# Patient Record
Sex: Male | Born: 1980 | Race: Black or African American | Hispanic: No | Marital: Married | Smoking: Former smoker
Health system: Southern US, Community
[De-identification: ages and names within clinical notes are randomized; demographics above are authoritative.]

---

## 2004-03-11 ENCOUNTER — Emergency Department (HOSPITAL_COMMUNITY): Admission: EM | Admit: 2004-03-11 | Discharge: 2004-03-11 | Payer: Self-pay | Admitting: Emergency Medicine

## 2004-04-01 ENCOUNTER — Emergency Department (HOSPITAL_COMMUNITY): Admission: EM | Admit: 2004-04-01 | Discharge: 2004-04-01 | Payer: Self-pay | Admitting: Emergency Medicine

## 2004-04-03 ENCOUNTER — Emergency Department (HOSPITAL_COMMUNITY): Admission: EM | Admit: 2004-04-03 | Discharge: 2004-04-03 | Payer: Self-pay | Admitting: Emergency Medicine

## 2004-04-04 ENCOUNTER — Emergency Department (HOSPITAL_COMMUNITY): Admission: EM | Admit: 2004-04-04 | Discharge: 2004-04-04 | Payer: Self-pay | Admitting: Emergency Medicine

## 2004-04-08 ENCOUNTER — Emergency Department (HOSPITAL_COMMUNITY): Admission: EM | Admit: 2004-04-08 | Discharge: 2004-04-08 | Payer: Self-pay | Admitting: Emergency Medicine

## 2004-08-13 ENCOUNTER — Emergency Department (HOSPITAL_COMMUNITY): Admission: EM | Admit: 2004-08-13 | Discharge: 2004-08-13 | Payer: Self-pay | Admitting: Emergency Medicine

## 2004-08-16 ENCOUNTER — Emergency Department (HOSPITAL_COMMUNITY): Admission: EM | Admit: 2004-08-16 | Discharge: 2004-08-16 | Payer: Self-pay | Admitting: Emergency Medicine

## 2004-10-28 ENCOUNTER — Ambulatory Visit: Payer: Self-pay | Admitting: Family Medicine

## 2004-11-03 ENCOUNTER — Ambulatory Visit: Payer: Self-pay | Admitting: Family Medicine

## 2005-06-22 ENCOUNTER — Emergency Department (HOSPITAL_COMMUNITY): Admission: EM | Admit: 2005-06-22 | Discharge: 2005-06-22 | Payer: Self-pay | Admitting: Emergency Medicine

## 2007-08-22 ENCOUNTER — Encounter: Payer: Self-pay | Admitting: Family Medicine

## 2009-06-01 ENCOUNTER — Emergency Department (HOSPITAL_COMMUNITY): Admission: EM | Admit: 2009-06-01 | Discharge: 2009-06-01 | Payer: Self-pay | Admitting: Emergency Medicine

## 2010-09-20 NOTE — Letter (Signed)
Summary: rpc chart  rpc chart   Imported By: Curtis Sites 06/06/2010 10:38:40  _____________________________________________________________________  External Attachment:    Type:   Image     Comment:   External Document

## 2011-10-21 ENCOUNTER — Encounter (HOSPITAL_COMMUNITY): Payer: Self-pay | Admitting: *Deleted

## 2011-10-21 ENCOUNTER — Emergency Department (HOSPITAL_COMMUNITY)
Admission: EM | Admit: 2011-10-21 | Discharge: 2011-10-21 | Disposition: A | Discharge status: Home / Self Care | Payer: Self-pay | Attending: Emergency Medicine | Admitting: Emergency Medicine

## 2011-10-21 DIAGNOSIS — IMO0002 Reserved for concepts with insufficient information to code with codable children: Secondary | ICD-10-CM | POA: Insufficient documentation

## 2011-10-21 DIAGNOSIS — L0291 Cutaneous abscess, unspecified: Secondary | ICD-10-CM

## 2011-10-21 MED ORDER — SULFAMETHOXAZOLE-TMP DS 800-160 MG PO TABS
1.0000 | ORAL_TABLET | Freq: Once | ORAL | Status: AC
  Administered 2011-10-21: 1 via ORAL
  Filled 2011-10-21: qty 1

## 2011-10-21 MED ORDER — HYDROCODONE-ACETAMINOPHEN 5-325 MG PO TABS
2.0000 | ORAL_TABLET | Freq: Once | ORAL | Status: AC
  Administered 2011-10-21: 2 via ORAL
  Filled 2011-10-21: qty 2

## 2011-10-21 MED ORDER — PENICILLIN V POTASSIUM 500 MG PO TABS: ORAL_TABLET | ORAL | Status: DC

## 2011-10-21 MED ORDER — SULFAMETHOXAZOLE-TRIMETHOPRIM 800-160 MG PO TABS: 1.0000 | ORAL_TABLET | Freq: Two times a day (BID) | ORAL | Status: AC

## 2011-10-21 MED ORDER — ONDANSETRON HCL 4 MG/2ML IJ SOLN
4.0000 mg | Freq: Once | INTRAMUSCULAR | Status: AC
  Administered 2011-10-21: 4 mg via INTRAVENOUS

## 2011-10-21 MED ORDER — HYDROCODONE-ACETAMINOPHEN 7.5-325 MG PO TABS: 1.0000 | ORAL_TABLET | ORAL | Status: AC | PRN

## 2011-10-21 MED ORDER — PENICILLIN V POTASSIUM 250 MG PO TABS
1000.0000 mg | ORAL_TABLET | Freq: Once | ORAL | Status: AC
  Administered 2011-10-21: 1000 mg via ORAL
  Filled 2011-10-21: qty 3
  Filled 2011-10-21: qty 1

## 2011-10-21 MED ORDER — IBUPROFEN 800 MG PO TABS
800.0000 mg | ORAL_TABLET | Freq: Once | ORAL | Status: AC
  Administered 2011-10-21: 800 mg via ORAL
  Filled 2011-10-21 (×2): qty 1

## 2011-10-21 NOTE — Discharge Instructions (Signed)
Please soak the arm pit areas in a tub of warm salt water 2 times daily. Penicillin 2 times daily with food until all taken. Septra 2 times daily with food until all taken. Ibuprofen 3 times daily. Norco for pain if needed. This medication may cause drowsiness, please use with caution. If the abscess areas are not improving in 4-6 days, please see the surgeon listed above for surgical evaluation and assistance with this problem.Abscess An abscess (boil or furuncle) is an infected area under your skin. This area is filled with yellowish white fluid (pus). HOME CARE   Only take medicine as told by your doctor.   Keep the skin clean around your abscess. Keep clothes that may touch the abscess clean.   Change any bandages (dressings) as told by your doctor.   Avoid direct skin contact with other people. The infection can spread by skin contact with others.   Practice good hygiene and do not share personal care items.   Do not share athletic equipment, towels, or whirlpools. Shower after every practice or work out session.   If a draining area cannot be covered:   Do not play sports.   Children should not go to daycare until the wound has healed or until fluid (drainage) stops coming out of the wound.   See your doctor for a follow-up visit as told.  GET HELP RIGHT AWAY IF:   There is more pain, puffiness (swelling), and redness in the wound site.   There is fluid or bleeding from the wound site.   You have muscle aches, chills, fever, or feel sick.   You or your child has a temperature by mouth above 102 F (38.9 C), not controlled by medicine.   Your baby is older than 3 months with a rectal temperature of 102 F (38.9 C) or higher.  MAKE SURE YOU:   Understand these instructions.   Will watch your condition.   Will get help right away if you are not doing well or get worse.  Document Released: 01/24/2008 Document Revised: 04/19/2011 Document Reviewed: 01/24/2008 [facility]  Patient Information 2012 [place], [place].

## 2011-10-21 NOTE — ED Notes (Signed)
Pt a/ox4. Resp even and unlabored. NAD at this time. D/C instructions reviewed with pt. Pt verbalized understanding. Pt ambulated to lobby with steady gate.  

## 2011-10-21 NOTE — ED Notes (Signed)
Abscess x 2 under right arm. States left arm was the same way but drained on its on and is better now.
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
## 2011-10-22 NOTE — ED Provider Notes (Signed)
Medical screening examination/treatment/procedure(s) were performed by non-physician practitioner and as supervising physician I was immediately available for consultation/collaboration.   Ramanda Paules L Lavanda Nevels, MD 10/22/11 2250 

## 2013-08-28 ENCOUNTER — Ambulatory Visit (INDEPENDENT_AMBULATORY_CARE_PROVIDER_SITE_OTHER): Payer: PRIVATE HEALTH INSURANCE | Admitting: Family Medicine

## 2013-08-28 ENCOUNTER — Encounter: Payer: Self-pay | Admitting: Family Medicine

## 2013-08-28 VITALS — BP 108/66 | HR 58 | Temp 98.7°F | Resp 20 | Ht 71.0 in | Wt 161.4 lb

## 2013-08-28 DIAGNOSIS — R7303 Prediabetes: Secondary | ICD-10-CM | POA: Insufficient documentation

## 2013-08-28 DIAGNOSIS — Z7689 Persons encountering health services in other specified circumstances: Secondary | ICD-10-CM

## 2013-08-28 DIAGNOSIS — R7309 Other abnormal glucose: Secondary | ICD-10-CM

## 2013-08-28 DIAGNOSIS — Z7189 Other specified counseling: Secondary | ICD-10-CM

## 2013-08-28 NOTE — Progress Notes (Signed)
Subjective:     Patient ID: Roberto Alexander, male   DOB: 24-May-1981, 33 y.o.   MRN: [id number]  HPI Comments: Roberto Alexander is a 33 y.o AAM here to establish care.  He works for a company and needed to review his labs they had drawn at work in October 2014 for insurance purposes. He has no medical conditions and isn't on any medications. He is married as has a 805 y.o daughter, who is also a patient here, Roberto Alexander.   He had borderline HTN and borderline DM on lab work. All the other labs were normal. He was in the normal range of risk factors and risk assessment. His Hgb A1c was 6.1. He doesn't really exercise but says he needs to get back on it. He says this is his New Year's resolution.   He has a fam hx positive for DM in his father and maternal GM and hypothyroidism in his mother. No heart disease or strokes runs in his family.   He denies tobacco use but has occasional alcohol use on special occasions. He denies any illicit drug use.     Review of Systems  All other systems reviewed and are negative.       Objective:   Physical Exam  Nursing note and vitals reviewed. Constitutional: He appears well-developed and well-nourished.  HENT:  Head: Normocephalic and atraumatic.  Psychiatric: He has a normal mood and affect. His behavior is normal.       Assessment:     [name] was seen today for new patient.  Diagnoses and associated orders for this visit:  Encounter to establish care  Prediabetes       Plan:     Follow up prn or 6 months after lifestyle modifications. Have discussed exercising at least 30 minutes a day, 5 days a week.

## 2013-08-28 NOTE — Patient Instructions (Signed)
Exercise to Stay Healthy Exercise helps you become and stay healthy. EXERCISE IDEAS AND TIPS Choose exercises that:  You enjoy.  Fit into your day. You do not need to exercise really hard to be healthy. You can do exercises at a slow or medium level and stay healthy. You can:  Stretch before and after working out.  Try yoga, Pilates, or tai chi.  Lift weights.  Walk fast, swim, jog, run, climb stairs, bicycle, dance, or rollerskate.  Take aerobic classes. Exercises that burn about 150 calories:  Running 1  miles in 15 minutes.  Playing volleyball for 45 to 60 minutes.  Washing and waxing a car for 45 to 60 minutes.  Playing touch football for 45 minutes.  Walking 1  miles in 35 minutes.  Pushing a stroller 1  miles in 30 minutes.  Playing basketball for 30 minutes.  Raking leaves for 30 minutes.  Bicycling 5 miles in 30 minutes.  Walking 2 miles in 30 minutes.  Dancing for 30 minutes.  Shoveling snow for 15 minutes.  Swimming laps for 20 minutes.  Walking up stairs for 15 minutes.  Bicycling 4 miles in 15 minutes.  Gardening for 30 to 45 minutes.  Jumping rope for 15 minutes.  Washing windows or floors for 45 to 60 minutes. Document Released: 09/09/2010 Document Revised: 10/30/2011 Document Reviewed: 09/09/2010 [employer] Patient Information 2014 [employer].  

## 2016-11-14 ENCOUNTER — Emergency Department (HOSPITAL_COMMUNITY)
Admission: EM | Admit: 2016-11-14 | Discharge: 2016-11-14 | Disposition: A | Discharge status: Home / Self Care | Payer: BLUE CROSS/BLUE SHIELD | Attending: Emergency Medicine | Admitting: Emergency Medicine

## 2016-11-14 DIAGNOSIS — L0889 Other specified local infections of the skin and subcutaneous tissue: Secondary | ICD-10-CM

## 2016-11-14 DIAGNOSIS — Z87891 Personal history of nicotine dependence: Secondary | ICD-10-CM | POA: Insufficient documentation

## 2016-11-14 DIAGNOSIS — R21 Rash and other nonspecific skin eruption: Secondary | ICD-10-CM | POA: Diagnosis present

## 2016-11-14 DIAGNOSIS — B353 Tinea pedis: Secondary | ICD-10-CM | POA: Diagnosis not present

## 2016-11-14 MED ORDER — FLUCONAZOLE 100 MG PO TABS: 100.0000 mg | ORAL_TABLET | Freq: Every day | ORAL | 0 refills | Status: AC

## 2016-11-14 MED ORDER — CEFDINIR 300 MG PO CAPS
300.0000 mg | ORAL_CAPSULE | Freq: Two times a day (BID) | ORAL | 0 refills | Status: AC
Start: 2016-11-14 — End: ?

## 2016-11-14 MED ORDER — ONDANSETRON HCL 4 MG PO TABS
4.0000 mg | ORAL_TABLET | Freq: Once | ORAL | Status: AC
  Administered 2016-11-14: 4 mg via ORAL
  Filled 2016-11-14: qty 1

## 2016-11-14 MED ORDER — IBUPROFEN 800 MG PO TABS
800.0000 mg | ORAL_TABLET | Freq: Once | ORAL | Status: AC
  Administered 2016-11-14: 800 mg via ORAL
  Filled 2016-11-14: qty 1

## 2016-11-14 MED ORDER — FLUCONAZOLE 100 MG PO TABS
200.0000 mg | ORAL_TABLET | Freq: Once | ORAL | Status: AC
  Administered 2016-11-14: 200 mg via ORAL
  Filled 2016-11-14: qty 2

## 2016-11-14 MED ORDER — HYDROCODONE-ACETAMINOPHEN 5-325 MG PO TABS
1.0000 | ORAL_TABLET | ORAL | 0 refills | Status: DC | PRN
Start: 2016-11-14 — End: 2017-02-21

## 2016-11-14 MED ORDER — CEPHALEXIN 500 MG PO CAPS
500.0000 mg | ORAL_CAPSULE | Freq: Once | ORAL | Status: AC
  Administered 2016-11-14: 500 mg via ORAL
  Filled 2016-11-14: qty 1

## 2016-11-14 NOTE — Discharge Instructions (Signed)
Please cleanse your feet with soap and water daily. Dryer feet thoroughly. Please elevate your feet and let them be open to air when possible. Use clean white socks daily. Change your dressing to your feet daily. Alternate your shoes. Please continue the athlete's foot gel you were given by the podiatry specialist. Please add Diflucan and Omnicef daily with food. See your primary physician or your podiatry specialist for additional evaluation if not improving.

## 2016-11-14 NOTE — ED Provider Notes (Signed)
[facility]-EMERGENCY DEPT Provider Note   CSN: [id number] Arrival date & time: 11/14/16  2046  By signing my name below, I, Modena JanskyAlbert Thayil, attest that this documentation has been prepared under the direction and in the presence of non-physician practitioner, Ivery QualeHobson Sumi Lye, PA-C. Electronically Signed: Modena JanskyAlbert Thayil, Scribe. 11/14/2016. 9:23 PM.  History   Chief Complaint Chief Complaint  Patient presents with  . Foot Pain   The history is provided by the patient. No language interpreter was used.  Rash   This is a new problem. The current episode started more than 2 days ago. The problem has been gradually worsening. The problem is associated with an unknown factor. There has been no fever. The rash is present on the right toes, right foot, right hand, left hand and genitalia. The pain is mild. The pain has been constant since onset. Associated symptoms include blisters.   HPI Comments: Roberto Alexander is a 36 y.o. male who presents to the Emergency Department complaining of constant moderate bilateral foot rash that started about a week ago. He reports having a blister with suspected fungus. He has been having discharge for the past week. He was seen by a podiatrist for the same complaint and given a topical gel and oral medication. His complaint has worsened and spread to his hands and genital area. He denies any other complaints.   No past medical history on file.  Patient Active Problem List   Diagnosis Date Noted  . Encounter to establish care 08/28/2013  . Prediabetes 08/28/2013    No past surgical history on file.     Home Medications    Prior to Admission medications   Not on File    Family History No family history on file.  Social History Social History  Substance Use Topics  . Smoking status: Former Games developermoker  . Smokeless tobacco: Not on file  . Alcohol use No     Allergies   Patient has no known allergies.   Review of Systems Review of Systems    Musculoskeletal: Positive for myalgias.  Skin: Positive for rash.  All other systems reviewed and are negative.    Physical Exam Updated Vital Signs BP 124/71 (BP Location: Right Arm)   Pulse 72   Temp 98 F (36.7 C) (Oral)   Resp 18   Ht 6\' 1"  (1.854 m)   Wt 165 lb (74.8 kg)   SpO2 100%   BMI 21.77 kg/m   Physical Exam  Constitutional: He appears well-developed and well-nourished. No distress.  HENT:  Head: Normocephalic and atraumatic.  Eyes: Conjunctivae are normal.  Neck: Neck supple.  Cardiovascular: Normal rate and regular rhythm.   No murmur heard. Pulmonary/Chest: Effort normal. No respiratory distress. He has no wheezes. He has no rales.  Abdominal: Soft.  Musculoskeletal: Normal range of motion.  Right foot: Emaciated skin with ruptured blisters to the dorsum of the right foot and web spaces between toes. Leaking of fluid present. Raw area at the base of the 5th metatarsal, also with drainage. Peeling of the skin on the plantar surface, mostly in the metatarsal heads. No evidence of puncture wound to the plantar surface. Area of dryness to dorsum. DP pulse is +2 PT pulse is +2. No red streaks going up the leg.   Left foot: Broken skin area on the web space between the 3rd and 4th and 4th and 5th toes of the left foot. Multiple macular bumps on the dorsum of the foot. No ulcer on the plantar  surface under the left 5th toes. No puncture wound of the plantar left foot. DP pulse is +2. PT pulse is +2. No red streaks going up the leg.   Full ROM of the ankles and toes.  Neurological: He is alert.  Skin: Skin is warm and dry.  Psychiatric: He has a normal mood and affect.  Nursing note and vitals reviewed.    ED Treatments / Results  DIAGNOSTIC STUDIES: Oxygen Saturation is 100% on RA, normal by my interpretation.    COORDINATION OF CARE: 9:27 PM- Pt advised of plan for treatment and pt agrees.  Labs (all labs ordered are listed, but only abnormal results are  displayed) Labs Reviewed - No data to display  EKG  EKG Interpretation None       Radiology No results found.  Procedures Procedures (including critical care time)  Medications Ordered in ED Medications  fluconazole (DIFLUCAN) tablet 200 mg (not administered)  ondansetron (ZOFRAN) tablet 4 mg (not administered)  ibuprofen (ADVIL,MOTRIN) tablet 800 mg (not administered)  cephALEXin (KEFLEX) capsule 500 mg (not administered)     Initial Impression / Assessment and Plan / ED Course  I have reviewed the triage vital signs and the nursing notes.  Pertinent labs & imaging results that were available during my care of the patient were reviewed by me and considered in my medical decision making (see chart for details).     **I have reviewed nursing notes, vital signs, and all appropriate lab and imaging results for this patient.*  Final Clinical Impressions(s) / ED Diagnoses   MDM: Examination suggests fungus infection with secondary skin infection. Pt advised to cleanse area with soap and water and dry thoroughly. Prescription for Diflucan and Ceftin given to the pt. Pt is to follow with PCP or podiatrist. Pt also advised to rotate his shoe and use clean white socks.   Final diagnoses:  Tinea pedis of both feet  Secondary infection of skin    New Prescriptions Discharge Medication List as of 11/14/2016  9:58 PM    START taking these medications   Details  cefdinir (OMNICEF) 300 MG capsule Take 1 capsule (300 mg total) by mouth 2 (two) times daily., Starting Tue 11/14/2016, Print    fluconazole (DIFLUCAN) 100 MG tablet Take 1 tablet (100 mg total) by mouth daily., Starting Tue 11/14/2016, Print    HYDROcodone-acetaminophen (NORCO/VICODIN) 5-325 MG tablet Take 1 tablet by mouth every 4 (four) hours as needed., Starting Tue 11/14/2016, Print       **I personally performed the services described in this documentation, which was scribed in my presence. The recorded information  has been reviewed and is accurate.Ivery Quale, PA-C 11/15/16 0032    Benjiman Core, MD 11/15/16 [phone]

## 2016-11-14 NOTE — ED Triage Notes (Addendum)
Pt reports seeing a podiatrist last week for a fungus on his right foot. Pt reports he was given a "pill" and a gel. Pt reports he feels like it is getting worse and like there is a blister on the bottom of his foot. Pt states he has a small amount of fungus on his left foot. Pt having difficulty walking. Pt reports he feels like it is spreading on his hands, leg and private areas.

## 2017-02-21 ENCOUNTER — Emergency Department (HOSPITAL_COMMUNITY): Payer: BLUE CROSS/BLUE SHIELD

## 2017-02-21 ENCOUNTER — Encounter (HOSPITAL_COMMUNITY): Payer: Self-pay | Admitting: Vascular Surgery

## 2017-02-21 ENCOUNTER — Emergency Department (HOSPITAL_COMMUNITY)
Admission: EM | Admit: 2017-02-21 | Discharge: 2017-02-21 | Disposition: A | Discharge status: Home / Self Care | Payer: BLUE CROSS/BLUE SHIELD | Attending: Emergency Medicine | Admitting: Emergency Medicine

## 2017-02-21 DIAGNOSIS — S0993XA Unspecified injury of face, initial encounter: Secondary | ICD-10-CM

## 2017-02-21 DIAGNOSIS — Z87891 Personal history of nicotine dependence: Secondary | ICD-10-CM | POA: Diagnosis not present

## 2017-02-21 DIAGNOSIS — Z23 Encounter for immunization: Secondary | ICD-10-CM | POA: Diagnosis not present

## 2017-02-21 DIAGNOSIS — S01111A Laceration without foreign body of right eyelid and periocular area, initial encounter: Secondary | ICD-10-CM | POA: Insufficient documentation

## 2017-02-21 DIAGNOSIS — Y929 Unspecified place or not applicable: Secondary | ICD-10-CM | POA: Diagnosis not present

## 2017-02-21 DIAGNOSIS — Y998 Other external cause status: Secondary | ICD-10-CM | POA: Insufficient documentation

## 2017-02-21 DIAGNOSIS — S0281XA Fracture of other specified skull and facial bones, right side, initial encounter for closed fracture: Secondary | ICD-10-CM | POA: Insufficient documentation

## 2017-02-21 DIAGNOSIS — Y939 Activity, unspecified: Secondary | ICD-10-CM | POA: Diagnosis not present

## 2017-02-21 DIAGNOSIS — S0285XA Fracture of orbit, unspecified, initial encounter for closed fracture: Secondary | ICD-10-CM

## 2017-02-21 DIAGNOSIS — S0181XA Laceration without foreign body of other part of head, initial encounter: Secondary | ICD-10-CM

## 2017-02-21 MED ORDER — HYDROCODONE-ACETAMINOPHEN 5-325 MG PO TABS
1.0000 | ORAL_TABLET | Freq: Four times a day (QID) | ORAL | 0 refills | Status: AC | PRN
Start: 2017-02-21 — End: ?

## 2017-02-21 MED ORDER — HYDROCODONE-ACETAMINOPHEN 5-325 MG PO TABS: 1.0000 | ORAL_TABLET | ORAL | 0 refills | Status: AC | PRN

## 2017-02-21 MED ORDER — TETANUS-DIPHTH-ACELL PERTUSSIS 5-2.5-18.5 LF-MCG/0.5 IM SUSP
0.5000 mL | Freq: Once | INTRAMUSCULAR | Status: AC
  Administered 2017-02-21: 0.5 mL via INTRAMUSCULAR
  Filled 2017-02-21: qty 0.5

## 2017-02-21 MED ORDER — SULFAMETHOXAZOLE-TRIMETHOPRIM 800-160 MG PO TABS: 1.0000 | ORAL_TABLET | Freq: Two times a day (BID) | ORAL | 0 refills | Status: AC

## 2017-02-21 MED ORDER — SULFAMETHOXAZOLE-TRIMETHOPRIM 800-160 MG PO TABS
1.0000 | ORAL_TABLET | Freq: Once | ORAL | Status: AC
  Administered 2017-02-21: 1 via ORAL
  Filled 2017-02-21: qty 1

## 2017-02-21 MED ORDER — ACETAMINOPHEN 325 MG PO TABS
650.0000 mg | ORAL_TABLET | Freq: Once | ORAL | Status: AC
Start: 2017-02-21 — End: 2017-02-21
  Administered 2017-02-21: 650 mg via ORAL
  Filled 2017-02-21: qty 2

## 2017-02-21 MED ORDER — LIDOCAINE-EPINEPHRINE (PF) 2 %-1:200000 IJ SOLN
10.0000 mL | Freq: Once | INTRAMUSCULAR | Status: AC
  Administered 2017-02-21: 10 mL
  Filled 2017-02-21: qty 20

## 2017-02-21 NOTE — ED Notes (Signed)
Pt to CT at this time.
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
## 2017-02-21 NOTE — ED Notes (Signed)
Pt has ice on his face.
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
## 2018-03-25 IMAGING — CT CT MAXILLOFACIAL W/O CM
3 of 8 series · 14 of 47 positions shown, 17 images · non-contrast
Comparison: Maxillofacial CT performed 06/22/2005

CLINICAL DATA: Status post facial trauma.  Initial encounter.

EXAM:
CT MAXILLOFACIAL WITHOUT CONTRAST
TECHNIQUE: Multidetector CT imaging of the maxillofacial structures was
performed. Multiplanar CT image reconstructions were also generated.
A small metallic BB was placed on the right temple in order to
reliably differentiate right from left.

[Series 4: st thins · axial · 0.35mm/px · z∈[-162,-23]mm · 9 of 249 slices shown, 12 images]
[im 25/249  brain]
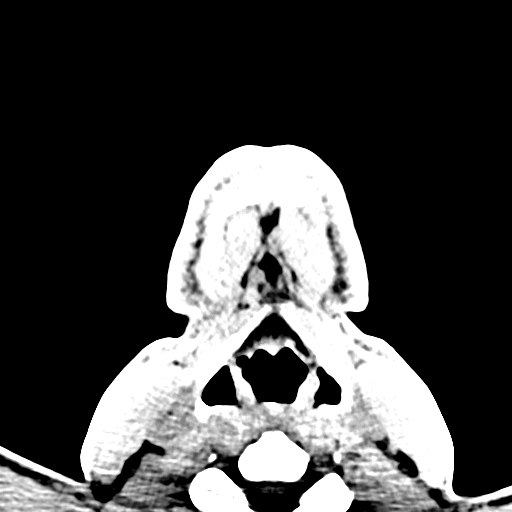
[im 25/249  bone]
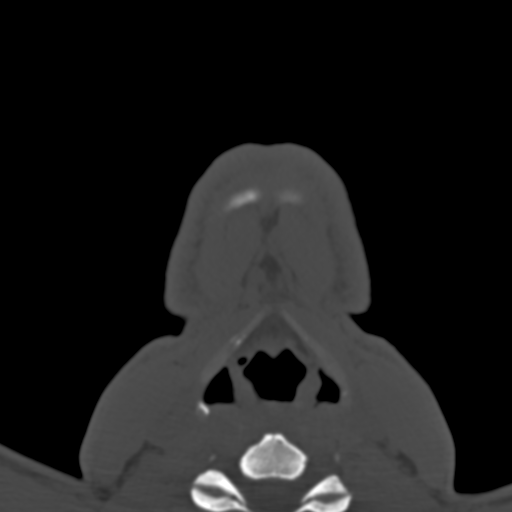
[im 50/249  bone]
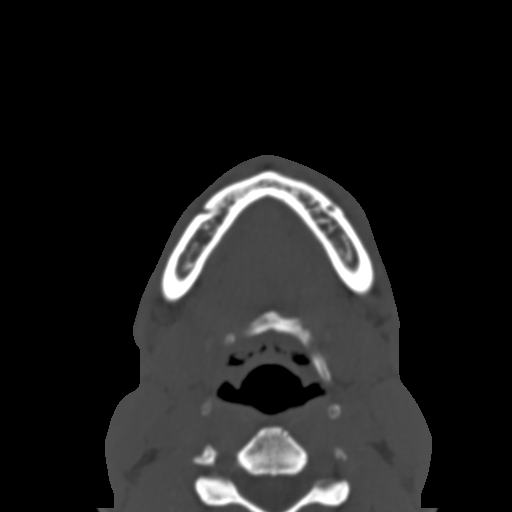
[im 75/249  bone]
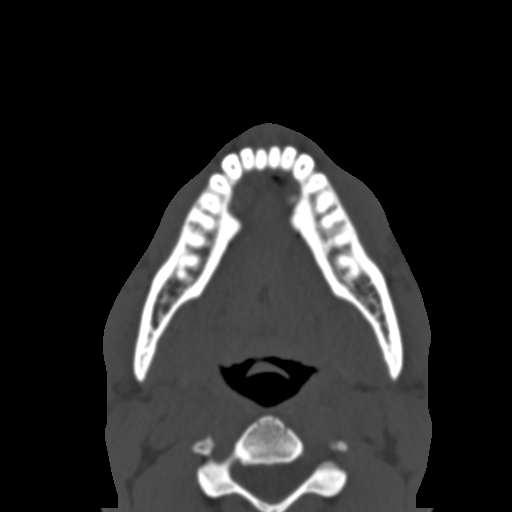
[im 100/249  bone]
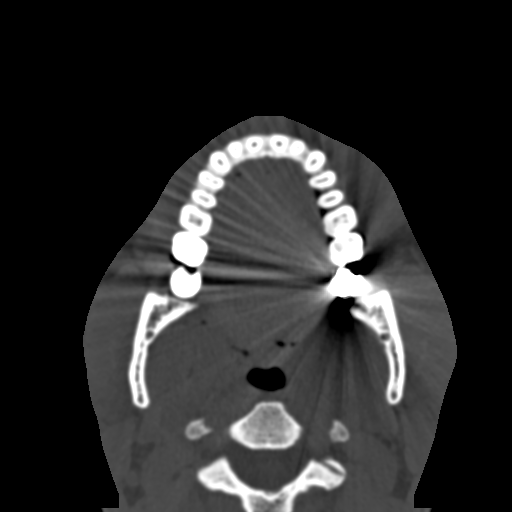
[im 125/249  brain]
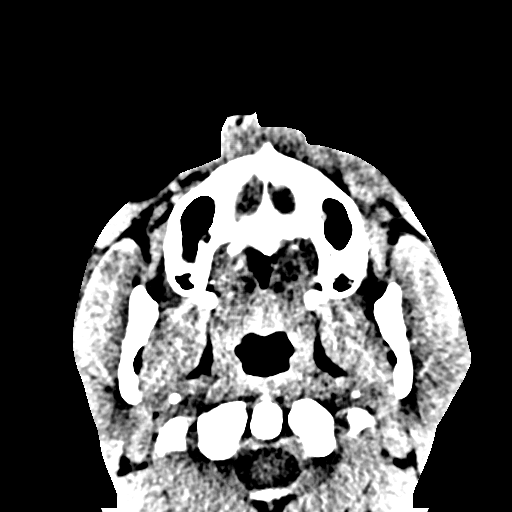
[im 125/249  bone]
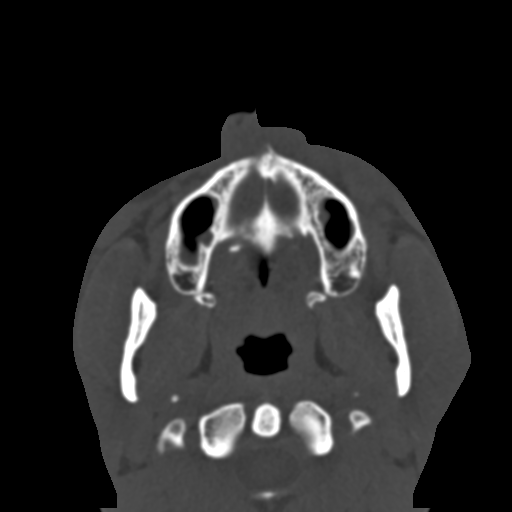
[im 149/249  bone]
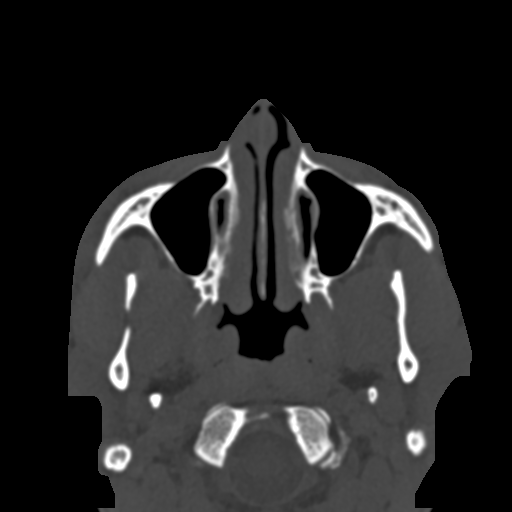
[im 174/249  bone]
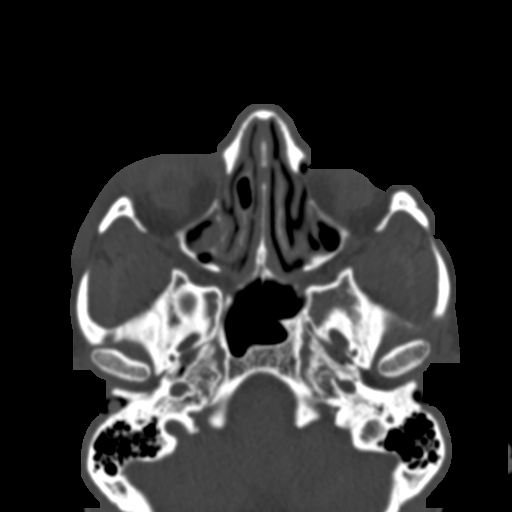
[im 199/249  bone]
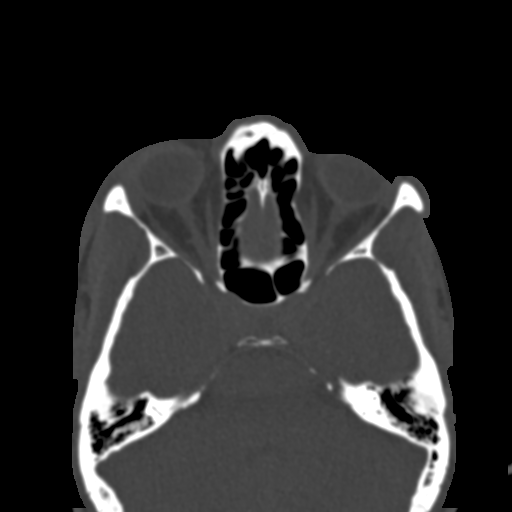
[im 224/249  brain]
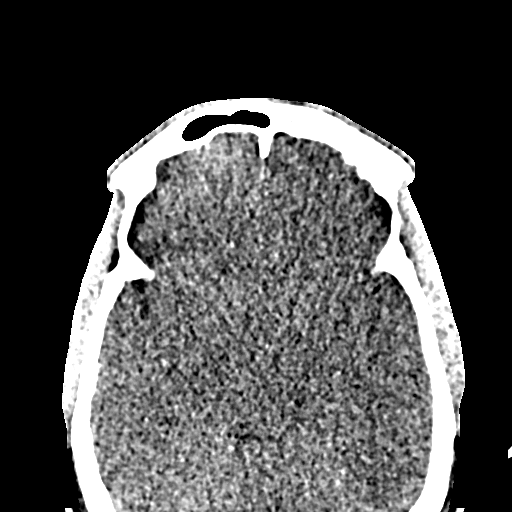
[im 224/249  bone]
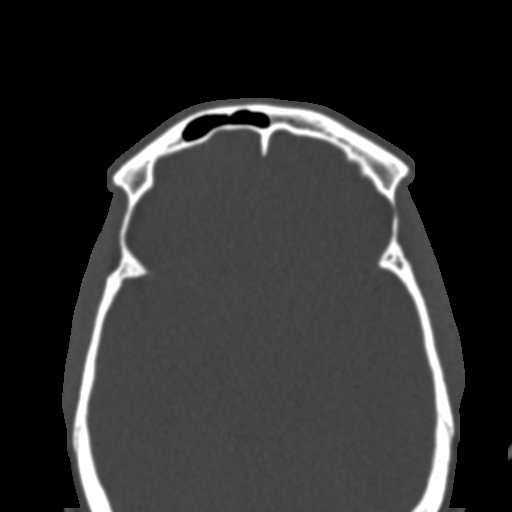

[Series 7: st cor · coronal · 0.34mm/px · 3 of 76 slices shown]
[im 19/76  bone]
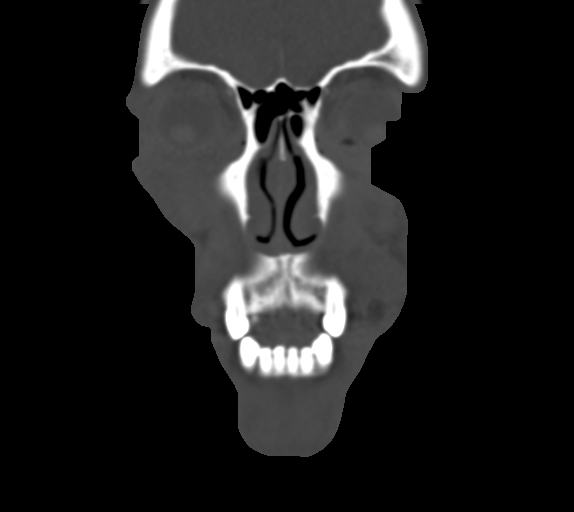
[im 38/76  bone]
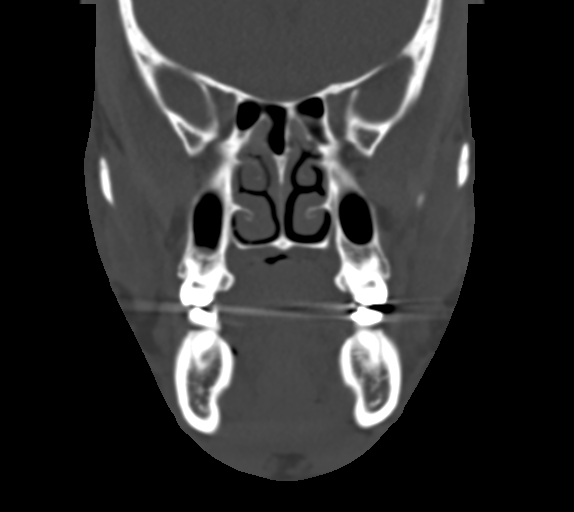
[im 57/76  bone]
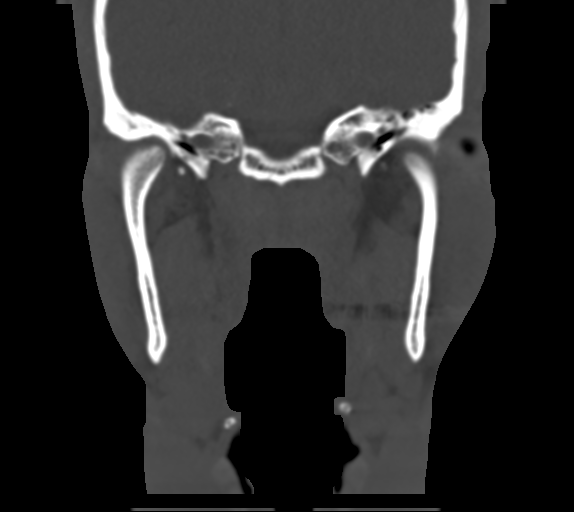

[Series 10: bone sag · sagittal · 0.34mm/px · 2 of 76 slices shown]
[im 26/76  bone]
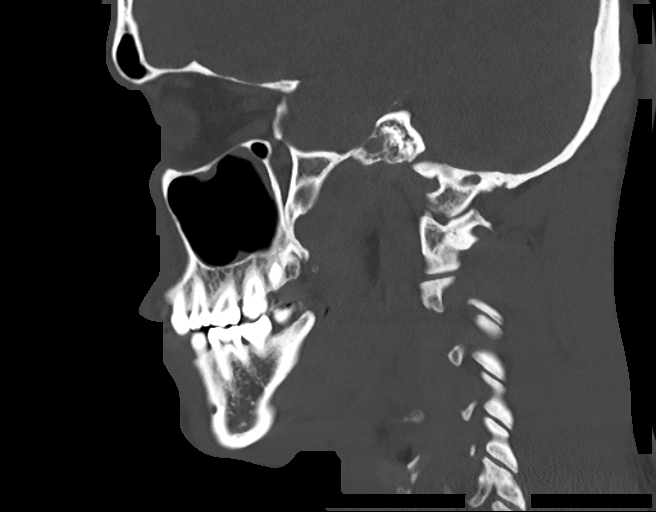
[im 51/76  bone]
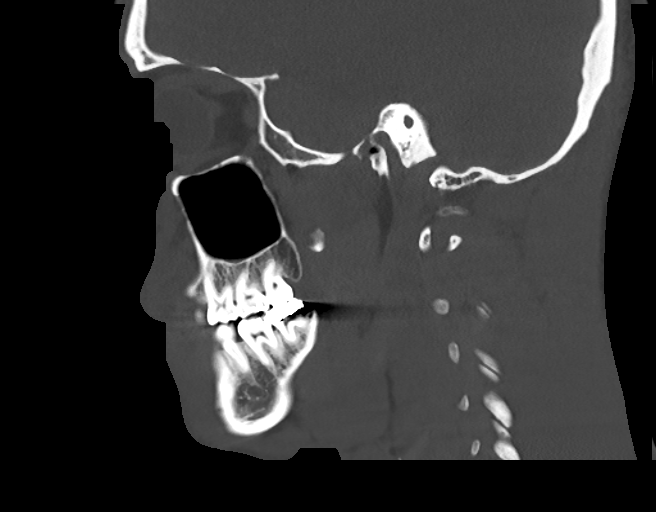

[14 of 47 positions shown; findings below may reference images not displayed]

FINDINGS: Osseous: There is no evidence of fracture or dislocation. The
maxilla and mandible appear intact. The nasal bone is unremarkable
in appearance. The visualized dentition demonstrates no acute
abnormality.

Orbits: The orbits are intact bilaterally.

Sinuses: The visualized paranasal sinuses and mastoid air cells are
well-aerated.

Soft tissues: Soft tissue swelling is noted surrounding the right
orbit and overlying the right maxilla.

The parapharyngeal fat planes are preserved. The nasopharynx,
oropharynx and hypopharynx are unremarkable in appearance. The
visualized portions of the valleculae and piriform sinuses are
grossly unremarkable. The parotid and submandibular glands are
within normal limits. No cervical lymphadenopathy is seen.

Limited intracranial: The visualized portions of the brain are
unremarkable.
IMPRESSION: 1. No evidence of fracture or dislocation with regard to the
maxillofacial structures.
2. Soft tissue swelling surrounding the right orbit and overlying
the right maxilla.

## 2019-04-07 ENCOUNTER — Other Ambulatory Visit: Payer: Self-pay

## 2019-04-07 DIAGNOSIS — Z20822 Contact with and (suspected) exposure to covid-19: Secondary | ICD-10-CM

## 2019-04-09 LAB — NOVEL CORONAVIRUS, NAA: SARS-CoV-2, NAA: NOT DETECTED

## 2020-04-22 ENCOUNTER — Other Ambulatory Visit: Payer: Self-pay

## 2020-04-22 ENCOUNTER — Encounter: Payer: Self-pay | Admitting: Emergency Medicine

## 2020-04-22 ENCOUNTER — Ambulatory Visit
Admission: EM | Admit: 2020-04-22 | Discharge: 2020-04-22 | Disposition: A | Discharge status: Home / Self Care | Payer: BC Managed Care – PPO | Attending: Emergency Medicine | Admitting: Emergency Medicine

## 2020-04-22 DIAGNOSIS — R05 Cough: Secondary | ICD-10-CM

## 2020-04-22 DIAGNOSIS — R059 Cough, unspecified: Secondary | ICD-10-CM

## 2020-04-22 NOTE — ED Triage Notes (Signed)
Wants covid test

## 2020-04-24 LAB — NOVEL CORONAVIRUS, NAA: SARS-CoV-2, NAA: DETECTED — AB

## 2020-04-26 ENCOUNTER — Telehealth: Payer: Self-pay | Admitting: Nurse Practitioner

## 2020-04-26 NOTE — Telephone Encounter (Signed)
Called to discuss with Merilynn Finland about Covid symptoms and the use of casirivimab/imdevimab, a combination monoclonal antibody infusion for those with mild to moderate Covid symptoms and at a high risk of hospitalization.     Pt is qualified for this infusion at the [facility] infusion center due to co-morbid conditions (as indicated below)  and/or a member of an at-risk group. Unable to reach. Voicemail left.    Patient Active Problem List   Diagnosis Date Noted  . Encounter to establish care 08/28/2013  . Prediabetes 08/28/2013    Willette Alma, AGPCNP-BC

## 2022-04-13 ENCOUNTER — Ambulatory Visit
Admission: EM | Admit: 2022-04-13 | Discharge: 2022-04-13 | Disposition: A | Discharge status: Home / Self Care | Payer: BC Managed Care – PPO

## 2022-04-13 ENCOUNTER — Ambulatory Visit (INDEPENDENT_AMBULATORY_CARE_PROVIDER_SITE_OTHER): Payer: Self-pay

## 2022-04-13 DIAGNOSIS — K5900 Constipation, unspecified: Secondary | ICD-10-CM

## 2022-04-13 DIAGNOSIS — R1033 Periumbilical pain: Secondary | ICD-10-CM

## 2022-04-13 DIAGNOSIS — R109 Unspecified abdominal pain: Secondary | ICD-10-CM

## 2022-04-13 LAB — POCT URINALYSIS DIP (MANUAL ENTRY)
Bilirubin, UA: NEGATIVE
Blood, UA: NEGATIVE
Glucose, UA: NEGATIVE mg/dL
Ketones, POC UA: NEGATIVE mg/dL
Leukocytes, UA: NEGATIVE
Nitrite, UA: NEGATIVE
Protein Ur, POC: 30 mg/dL — AB
Spec Grav, UA: 1.025 (ref 1.010–1.025)
Urobilinogen, UA: 0.2 E.U./dL
pH, UA: 6.5 (ref 5.0–8.0)

## 2022-04-13 MED ORDER — ALUM & MAG HYDROXIDE-SIMETH 200-200-20 MG/5ML PO SUSP
30.0000 mL | Freq: Once | ORAL | Status: AC
  Administered 2022-04-13: 30 mL via ORAL

## 2022-04-13 MED ORDER — SENNA 8.6 MG PO TABS: 1.0000 | ORAL_TABLET | Freq: Every day | ORAL | 0 refills | Status: AC

## 2022-04-13 MED ORDER — MYLANTA MAXIMUM STRENGTH 400-400-40 MG/5ML PO SUSP: 15.0000 mL | Freq: Four times a day (QID) | ORAL | 0 refills | Status: AC | PRN

## 2022-04-13 MED ORDER — POLYETHYLENE GLYCOL 3350 17 GM/SCOOP PO POWD: 1.0000 | Freq: Once | ORAL | 0 refills | Status: AC

## 2022-04-13 NOTE — ED Triage Notes (Signed)
Pt reports abdominal pain after eating x 1 week. Pt think he has an ulcer. Tums gives some relief.

## 2022-04-13 NOTE — Discharge Instructions (Addendum)
Your urinalysis is negative for urinary tract infection.  Your x-rays do show "moderate volume of formed stool throughout the colon suggestive of constipation". Take medication as prescribed. Increase fluids.  Try to drink at least 10-12 8 ounce glasses of water daily this will help with constipation. Increase the fruits and vegetables in your diet.  This will also help with constipation. Remain as active as possible. May take Tylenol as needed for pain or discomfort. May continue use of Tums as needed.  If you continue to experience abdominal pain after eating or if symptoms worsen, please follow-up with your primary care physician for further evaluation. Go to the emergency department immediately if you develop fever, chills, worsening abdominal pain, nausea or vomiting that you cannot control, or other concerns.

## 2022-04-13 NOTE — ED Provider Notes (Signed)
[facility]    CSN: [id number] Arrival date & time: 04/13/22  1033      History   Chief Complaint Chief Complaint  Patient presents with   Abdominal Pain    HPI Roberto Alexander is a 41 y.o. male.   The history is provided by the patient.   Patient presents with 1 week history of abdominal pain.  Patient states pain occurs after he eats.  Pain is located around the umbilicus.  He also endorses bloating and gas.  Patient describes the pain as "burning" and states that the pain comes and goes.  He states that it "hit him" when he was working 1 day ago.  His last bowel movement was 1 day ago.  He denies fever, chills, chest pain, heartburn, nausea, vomiting, constipation, diarrhea, or urinary symptoms.  Patient states that he has tried Tylenol which has not given him any relief.  Reports some relief with the use of Tums.  History reviewed. No pertinent past medical history.  Patient Active Problem List   Diagnosis Date Noted   Encounter to establish care 08/28/2013   Prediabetes 08/28/2013    History reviewed. No pertinent surgical history.     Home Medications    Prior to Admission medications   Medication Sig Start Date End Date Taking? Authorizing Provider  alum & mag hydroxide-simeth (MYLANTA MAXIMUM STRENGTH) 400-400-40 MG/5ML suspension Take 15 mLs by mouth every 6 (six) hours as needed for indigestion. 04/13/22  Yes Jayen Bromwell-Warren, Sadie Haber, NP  calcium carbonate (TUMS - DOSED IN MG ELEMENTAL CALCIUM) 500 MG chewable tablet Chew 1 tablet by mouth daily.   Yes [provider]  polyethylene glycol powder (GLYCOLAX/MIRALAX) 17 GM/SCOOP powder Take 255 g by mouth once for 1 dose. 04/13/22 04/13/22 Yes Liannah Yarbough-Warren, Sadie Haber, NP  senna (SENOKOT) 8.6 MG TABS tablet Take 1 tablet (8.6 mg total) by mouth at bedtime. 04/13/22 05/13/22 Yes Shaena Parkerson-Warren, Sadie Haber, NP  cefdinir (OMNICEF) 300 MG capsule Take 1 capsule (300 mg total) by mouth 2 (two) times  daily. Patient not taking: Reported on 02/21/2017 11/14/16   Ivery Quale, PA-C  fluconazole (DIFLUCAN) 100 MG tablet Take 1 tablet (100 mg total) by mouth daily. Patient not taking: Reported on 02/21/2017 11/14/16   Ivery Quale, PA-C  HYDROcodone-acetaminophen (NORCO/VICODIN) 5-325 MG tablet Take 1 tablet by mouth every 4 (four) hours as needed. 02/21/17   Earley Favor, NP  HYDROcodone-acetaminophen (NORCO/VICODIN) 5-325 MG tablet Take 1 tablet by mouth every 6 (six) hours as needed. 02/21/17   Earley Favor, NP    Family History History reviewed. No pertinent family history.  Social History Social History   Tobacco Use   Smoking status: Former    Types: Cigarettes   Smokeless tobacco: Never  Substance Use Topics   Alcohol use: Yes    Comment: occasionally   Drug use: No     Allergies   Keflex [cephalexin]   Review of Systems Review of Systems Per HPI  Physical Exam Triage Vital Signs ED Triage Vitals  Enc Vitals Group     BP 04/13/22 1043 125/82     Pulse Rate 04/13/22 1043 72     Resp 04/13/22 1043 16     Temp 04/13/22 1043 98.8 F (37.1 C)     Temp Source 04/13/22 1043 Oral     SpO2 04/13/22 1043 97 %     Weight --      Height --      Head Circumference --  Peak Flow --      Pain Score 04/13/22 1041 10     Pain Loc --      Pain Edu? --      Excl. in GC? --    No data found.  Updated Vital Signs BP 125/82 (BP Location: Right Arm)   Pulse 72   Temp 98.8 F (37.1 C) (Oral)   Resp 16   SpO2 97%   Visual Acuity Right Eye Distance:   Left Eye Distance:   Bilateral Distance:    Right Eye Near:   Left Eye Near:    Bilateral Near:     Physical Exam Vitals and nursing note reviewed.  Constitutional:      General: He is not in acute distress.    Appearance: He is well-developed.  HENT:     Head: Normocephalic.  Eyes:     Extraocular Movements: Extraocular movements intact.     Conjunctiva/sclera: Conjunctivae normal.     Pupils: Pupils are  equal, round, and reactive to light.  Cardiovascular:     Rate and Rhythm: Normal rate and regular rhythm.     Heart sounds: Normal heart sounds.  Pulmonary:     Effort: Pulmonary effort is normal.     Breath sounds: Normal breath sounds.  Abdominal:     General: Abdomen is flat. Bowel sounds are normal.     Palpations: Abdomen is soft.     Tenderness: There is no abdominal tenderness. There is no right CVA tenderness, left CVA tenderness or guarding.     Comments: Reports improvement of pain after Mylanta/Maalox administration.  Musculoskeletal:     Cervical back: Normal range of motion.  Lymphadenopathy:     Cervical: No cervical adenopathy.  Skin:    General: Skin is warm and dry.  Neurological:     General: No focal deficit present.     Mental Status: He is alert and oriented to person, place, and time.  Psychiatric:        Mood and Affect: Mood normal.        Behavior: Behavior normal.      UC Treatments / Results  Labs (all labs ordered are listed, but only abnormal results are displayed) Labs Reviewed  POCT URINALYSIS DIP (MANUAL ENTRY) - Abnormal; Notable for the following components:      Result Value   Protein Ur, POC =30 (*)    All other components within normal limits    EKG   Radiology DG Abd 1 View  Result Date: 04/13/2022 CLINICAL DATA:  One week of abdominal pain postprandial. EXAM: ABDOMEN - 1 VIEW COMPARISON:  None Available. FINDINGS: The bowel gas pattern is normal. Calcification projecting adjacent to the right T4 transverse process is favored to be an ingested tablet located within the hepatic flexure. Moderate volume of formed stool throughout the colon. IMPRESSION: Moderate volume of formed stool throughout the colon suggestive of constipation. No evidence of bowel obstruction. Electronically Signed   By: Maudry Mayhew M.D.   On: 04/13/2022 11:31    Procedures Procedures (including critical care time)  Medications Ordered in UC Medications   alum & mag hydroxide-simeth (MAALOX/MYLANTA) 200-200-20 MG/5ML suspension 30 mL (30 mLs Oral Given 04/13/22 1124)    Initial Impression / Assessment and Plan / UC Course  I have reviewed the triage vital signs and the nursing notes.  Pertinent labs & imaging results that were available during my care of the patient were reviewed by me and considered in my medical  decision making (see chart for details).  Patient presents for complaints of abdominal pain that been present for the past week.  Patient states pain presents after eating.  Patient's vital signs are stable, he is in no acute distress, there are no red flag symptoms of acute abdomen on his exam.  X-rays show constipation.  No other abnormalities.  Discussed with patient that symptoms could be related to peptic ulcer disease or ulcerative colitis, but he would need further work-up for this diagnoses.  Patient did report improvement of pain after Maalox/Mylanta was administered.  In the interim, patient was prescribed MiraLAX, Senokot, and Mylanta for his symptoms.  Patient was given supportive care recommendations to follow at home.  Patient was advised to establish care with a primary care physician for further evaluation.  Patient was given the information to establish care with a primary care provider within the [facility] health system.  Patient verbalizes understanding.  All questions were answered. Final Clinical Impressions(s) / UC Diagnoses   Final diagnoses:  Periumbilical abdominal pain  Constipation, unspecified constipation type     Discharge Instructions      Your urinalysis is negative for urinary tract infection.  Your x-rays do show "moderate volume of formed stool throughout the colon suggestive of constipation". Take medication as prescribed. Increase fluids.  Try to drink at least 10-12 8 ounce glasses of water daily this will help with constipation. Increase the fruits and vegetables in your diet.  This will also help  with constipation. Remain as active as possible. May take Tylenol as needed for pain or discomfort. May continue use of Tums as needed.  If you continue to experience abdominal pain after eating or if symptoms worsen, please follow-up with your primary care physician for further evaluation. Go to the emergency department immediately if you develop fever, chills, worsening abdominal pain, nausea or vomiting that you cannot control, or other concerns.     ED Prescriptions     Medication Sig Dispense Auth. Provider   polyethylene glycol powder (GLYCOLAX/MIRALAX) 17 GM/SCOOP powder Take 255 g by mouth once for 1 dose. 289 g Jhoselin Crume-Warren, Sadie Haber, NP   senna (SENOKOT) 8.6 MG TABS tablet Take 1 tablet (8.6 mg total) by mouth at bedtime. 30 tablet Halsey Persaud-Warren, Sadie Haber, NP   alum & mag hydroxide-simeth (MYLANTA MAXIMUM STRENGTH) 400-400-40 MG/5ML suspension Take 15 mLs by mouth every 6 (six) hours as needed for indigestion. 355 mL Armel Rabbani-Warren, Sadie Haber, NP      PDMP not reviewed this encounter.   Abran Cantor, NP 04/13/22 1155

## 2023-11-23 DIAGNOSIS — M2559 Pain in other specified joint: Secondary | ICD-10-CM | POA: Diagnosis not present

## 2024-09-24 ENCOUNTER — Ambulatory Visit: Admitting: Family Medicine
